# Patient Record
Sex: Female | Born: 1975 | Race: White | Hispanic: No | Smoking: Former smoker
Health system: Southern US, Community
[De-identification: ages and names within clinical notes are randomized; demographics above are authoritative.]

## PROBLEM LIST (undated history)

## (undated) DIAGNOSIS — J45909 Unspecified asthma, uncomplicated: Secondary | ICD-10-CM

## (undated) HISTORY — DX: Unspecified asthma, uncomplicated: J45.909

## (undated) HISTORY — PX: TUBAL LIGATION: SHX77

---

## 2004-12-19 ENCOUNTER — Emergency Department (HOSPITAL_COMMUNITY): Admission: EM | Admit: 2004-12-19 | Discharge: 2004-12-19 | Payer: Self-pay | Admitting: Emergency Medicine

## 2005-09-01 ENCOUNTER — Other Ambulatory Visit: Admission: RE | Admit: 2005-09-01 | Discharge: 2005-09-01 | Payer: Self-pay | Admitting: Obstetrics and Gynecology
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
## 2011-10-02 ENCOUNTER — Emergency Department (HOSPITAL_BASED_OUTPATIENT_CLINIC_OR_DEPARTMENT_OTHER)
Admission: EM | Admit: 2011-10-02 | Discharge: 2011-10-02 | Disposition: A | Discharge status: Home / Self Care | Payer: Commercial Managed Care - PPO | Attending: Emergency Medicine | Admitting: Emergency Medicine

## 2011-10-02 ENCOUNTER — Encounter (HOSPITAL_BASED_OUTPATIENT_CLINIC_OR_DEPARTMENT_OTHER): Payer: Self-pay | Admitting: *Deleted

## 2011-10-02 DIAGNOSIS — Y93G1 Activity, food preparation and clean up: Secondary | ICD-10-CM | POA: Insufficient documentation

## 2011-10-02 DIAGNOSIS — S61519A Laceration without foreign body of unspecified wrist, initial encounter: Secondary | ICD-10-CM

## 2011-10-02 DIAGNOSIS — S61209A Unspecified open wound of unspecified finger without damage to nail, initial encounter: Secondary | ICD-10-CM | POA: Insufficient documentation

## 2011-10-02 DIAGNOSIS — W268XXA Contact with other sharp object(s), not elsewhere classified, initial encounter: Secondary | ICD-10-CM | POA: Insufficient documentation

## 2011-10-02 DIAGNOSIS — S61509A Unspecified open wound of unspecified wrist, initial encounter: Secondary | ICD-10-CM | POA: Insufficient documentation

## 2011-10-02 DIAGNOSIS — Y92009 Unspecified place in unspecified non-institutional (private) residence as the place of occurrence of the external cause: Secondary | ICD-10-CM | POA: Insufficient documentation

## 2011-10-02 DIAGNOSIS — S61219A Laceration without foreign body of unspecified finger without damage to nail, initial encounter: Secondary | ICD-10-CM

## 2011-10-02 NOTE — Discharge Instructions (Signed)
Laceration Care, Adult °A laceration is a cut that goes through all layers of the skin. The cut goes into the tissue beneath the skin. °HOME CARE °For stitches (sutures) or staples: °· Keep the cut clean and dry.  °· If you have a bandage (dressing), change it at least once a day. Change the bandage if it gets wet or dirty, or as told by your doctor.  °· Wash the cut with soap and water 2 times a day. Rinse the cut with water. Pat it dry with a clean towel.  °· Put a thin layer of medicated cream on the cut as told by your doctor.  °· You may shower after the first 24 hours. Do not soak the cut in water until the stitches are removed.  °· Only take medicines as told by your doctor.  °· Have your stitches or staples removed as told by your doctor.  °For skin adhesive strips: °· Keep the cut clean and dry.  °· Do not get the strips wet. You may take a bath, but be careful to keep the cut dry.  °· If the cut gets wet, pat it dry with a clean towel.  °· The strips will fall off on their own. Do not remove the strips that are still stuck to the cut.  °For wound glue: °· You may shower or take baths. Do not soak or scrub the cut. Do not swim. Avoid heavy sweating until the glue falls off on its own. After a shower or bath, pat the cut dry with a clean towel.  °· Do not put medicine on your cut until the glue falls off.  °· If you have a bandage, do not put tape over the glue.  °· Avoid lots of sunlight or tanning lamps until the glue falls off. Put sunscreen on the cut for the first year to reduce your scar.  °· The glue will fall off on its own. Do not pick at the glue.  °You may need a tetanus shot if: °· You cannot remember when you had your last tetanus shot.  °· You have never had a tetanus shot.  °If you need a tetanus shot and you choose not to have one, you may get tetanus. Sickness from tetanus can be serious. °GET HELP RIGHT AWAY IF:  °· Your pain does not get better with medicine.  °· Your arm, hand, leg, or  foot loses feeling (numbness) or changes color.  °· Your cut is bleeding.  °· Your joint feels weak, or you cannot use your joint.  °· You have painful lumps on your body.  °· Your cut is red, puffy (swollen), or painful.  °· You have a red line on the skin near the cut.  °· You have yellowish-white fluid (pus) coming from the cut.  °· You have a fever.  °· You have a bad smell coming from the cut or bandage.  °· Your cut breaks open before or after stitches are removed.  °· You notice something coming out of the cut, such as wood or glass.  °· You cannot move a finger or toe.  °MAKE SURE YOU:  °· Understand these instructions.  °· Will watch your condition.  °· Will get help right away if you are not doing well or get worse.  °Document Released: 12/03/2007 Document Revised: 06/05/2011 Document Reviewed: 12/10/2010 °[employer]® Patient Information ©2012 [employer]. °

## 2011-10-02 NOTE — ED Notes (Addendum)
Pt cut her right wrist (bilateral sides), right thumb and left pinky finger against a broken glass coffee mug in the sink tonight. Bleeding controlled. Tetanus UTD.

## 2011-10-02 NOTE — ED Notes (Signed)
Pt placed in betadine soak per triage. Bleeding controlled.

## 2011-10-02 NOTE — ED Provider Notes (Signed)
History     CSN: [id number]  Arrival date & time 10/02/11  2002   First MD Initiated Contact with Patient 10/02/11 2012      Chief Complaint  Patient presents with  . Extremity Laceration    (Consider location/radiation/quality/duration/timing/severity/associated sxs/prior treatment) HPI Comments: Pt states that the mug was broken and it cut her while washing it is the sink:pt c/o multiple laceration  Patient is a 36 y.o. female presenting with skin laceration. The history is provided by the patient. No language interpreter was used.  Laceration  The incident occurred less than 1 hour ago. The laceration is located on the left hand and right hand. Injury mechanism: broken ceramic mug. The pain is mild. The pain has been constant since onset. She reports no foreign bodies present. Her tetanus status is UTD.    History reviewed. No pertinent past medical history.  Past Surgical History  Procedure Date  . Tubal ligation     No family history on file.  History  Substance Use Topics  . Smoking status: Former Games developer  . Smokeless tobacco: Not on file  . Alcohol Use: No    OB History    Grav Para Term Preterm Abortions TAB SAB Ect Mult Living                  Review of Systems  Constitutional: Negative.   Eyes: Negative.   Respiratory: Negative.   Cardiovascular: Negative.   Skin: Positive for wound.  Neurological: Negative.     Allergies  Review of patient's allergies indicates no known allergies.  Home Medications  No current outpatient prescriptions on file.  BP 134/88  Pulse 92  Temp(Src) 97.7 F (36.5 C) (Oral)  Resp 18  Ht 5\' 7"  (1.702 m)  Wt 170 lb (77.111 kg)  BMI 26.63 kg/m2  SpO2 100%  LMP 09/25/2011  Physical Exam  Nursing note and vitals reviewed. Constitutional: She is oriented to person, place, and time. She appears well-developed and well-nourished.  Cardiovascular: Normal rate and regular rhythm.   Pulmonary/Chest: Effort normal and  breath sounds normal.  Musculoskeletal: Normal range of motion.  Neurological: She is alert and oriented to person, place, and time.  Skin:       Pt has a laceration noted to the middle phalanx of left index finger:pt has a laceration to the right thumb:pt has 2 laceration and wrist wrist    ED Course  LACERATION REPAIR Performed by: Teressa Lower Authorized by: Teressa Lower Consent: Verbal consent obtained. Written consent not obtained. Risks and benefits: risks, benefits and alternatives were discussed Consent given by: patient Patient understanding: patient states understanding of the procedure being performed Patient identity confirmed: verbally with patient Time out: Immediately prior to procedure a "time out" was called to verify the correct patient, procedure, equipment, support staff and site/side marked as required. Body area: upper extremity Location details: left small finger Laceration length: 1 cm Foreign bodies: no foreign bodies Irrigation solution: saline Irrigation method: syringe Amount of cleaning: standard Skin closure: glue Approximation: close Approximation difficulty: simple Patient tolerance: Patient tolerated the procedure well with no immediate complications.  LACERATION REPAIR Performed by: Teressa Lower Authorized by: Teressa Lower Patient identity confirmed: verbally with patient Time out: Immediately prior to procedure a "time out" was called to verify the correct patient, procedure, equipment, support staff and site/side marked as required. Body area: upper extremity Location details: right thumb Laceration length: 1 cm Foreign bodies: no foreign bodies Irrigation solution: saline Irrigation method:  syringe Amount of cleaning: standard Skin closure: glue  LACERATION REPAIR Performed by: Teressa Lower Authorized by: Teressa Lower Patient identity confirmed: verbally with patient Time out: Immediately prior to  procedure a "time out" was called to verify the correct patient, procedure, equipment, support staff and site/side marked as required. Body area: upper extremity Location details: right wrist Laceration length: 1.5 cm Foreign bodies: no foreign bodies Skin closure: glue  LACERATION REPAIR Performed by: Teressa Lower Authorized by: Teressa Lower Patient identity confirmed: verbally with patient Time out: Immediately prior to procedure a "time out" was called to verify the correct patient, procedure, equipment, support staff and site/side marked as required. Body area: upper extremity Location details: right wrist Foreign bodies: no foreign bodies Skin closure: glue   (including critical care time)  Labs Reviewed - No data to display No results found.   1. Finger laceration   2. Wrist laceration       MDM  Wounds closed:no concern for RU:[id number] of lacerations with mechanism        Teressa Lower, NP 10/02/11 2107

## 2011-10-02 NOTE — ED Provider Notes (Signed)
Medical screening examination/treatment/procedure(s) were performed by non-physician practitioner and as supervising physician I was immediately available for consultation/collaboration.   Shanese Riemenschneider A Shakim Faith, MD 10/02/11 2321 

## 2013-06-30 HISTORY — PX: RADICAL HYSTERECTOMY: SHX2283

## 2018-06-26 ENCOUNTER — Encounter: Payer: Self-pay | Admitting: Emergency Medicine

## 2018-06-26 ENCOUNTER — Emergency Department (HOSPITAL_COMMUNITY): Payer: 59

## 2018-06-26 ENCOUNTER — Emergency Department (HOSPITAL_COMMUNITY)
Admission: EM | Admit: 2018-06-26 | Discharge: 2018-06-26 | Disposition: A | Discharge status: Home / Self Care | Payer: 59 | Attending: Emergency Medicine | Admitting: Emergency Medicine

## 2018-06-26 DIAGNOSIS — Z87891 Personal history of nicotine dependence: Secondary | ICD-10-CM | POA: Diagnosis not present

## 2018-06-26 DIAGNOSIS — L0231 Cutaneous abscess of buttock: Secondary | ICD-10-CM | POA: Diagnosis present

## 2018-06-26 LAB — CBC WITH DIFFERENTIAL/PLATELET
ABS IMMATURE GRANULOCYTES: 0.06 10*3/uL (ref 0.00–0.07)
BASOS ABS: 0.1 10*3/uL (ref 0.0–0.1)
Basophils Relative: 1 %
Eosinophils Absolute: 0.2 10*3/uL (ref 0.0–0.5)
Eosinophils Relative: 2 %
HEMATOCRIT: 43.3 % (ref 36.0–46.0)
HEMOGLOBIN: 14.2 g/dL (ref 12.0–15.0)
Immature Granulocytes: 1 %
LYMPHS ABS: 1.5 10*3/uL (ref 0.7–4.0)
LYMPHS PCT: 17 %
MCH: 32.9 pg (ref 26.0–34.0)
MCHC: 32.8 g/dL (ref 30.0–36.0)
MCV: 100.2 fL — ABNORMAL HIGH (ref 80.0–100.0)
MONO ABS: 0.7 10*3/uL (ref 0.1–1.0)
Monocytes Relative: 8 %
NEUTROS ABS: 6.7 10*3/uL (ref 1.7–7.7)
Neutrophils Relative %: 71 %
Platelets: 201 10*3/uL (ref 150–400)
RBC: 4.32 MIL/uL (ref 3.87–5.11)
RDW: 11.7 % (ref 11.5–15.5)
WBC: 9.2 10*3/uL (ref 4.0–10.5)
nRBC: 0 % (ref 0.0–0.2)

## 2018-06-26 LAB — BASIC METABOLIC PANEL
Anion gap: 7 (ref 5–15)
BUN: 10 mg/dL (ref 6–20)
CHLORIDE: 106 mmol/L (ref 98–111)
CO2: 26 mmol/L (ref 22–32)
Calcium: 8.9 mg/dL (ref 8.9–10.3)
Creatinine, Ser: 0.87 mg/dL (ref 0.44–1.00)
GFR calc Af Amer: >60 mL/min (ref >60)
GFR calc non Af Amer: >60 mL/min (ref >60)
Glucose, Bld: 94 mg/dL (ref 70–99)
POTASSIUM: 4.2 mmol/L (ref 3.5–5.1)
SODIUM: 139 mmol/L (ref 135–145)

## 2018-06-26 LAB — I-STAT BETA HCG BLOOD, ED (MC, WL, AP ONLY)

## 2018-06-26 MED ORDER — IOHEXOL 300 MG/ML  SOLN
100.0000 mL | Freq: Once | INTRAMUSCULAR | Status: AC | PRN
  Administered 2018-06-26: 100 mL via INTRAVENOUS

## 2018-06-26 MED ORDER — LIDOCAINE HCL (PF) 1 % IJ SOLN
5.0000 mL | Freq: Once | INTRAMUSCULAR | Status: AC
  Administered 2018-06-26: 5 mL
  Filled 2018-06-26: qty 5

## 2018-06-26 NOTE — Discharge Instructions (Signed)
Complete your antibiotic course. Follow-up with your primary care provider. Return to ED for worsening symptoms, fevers, increased drainage or swelling, blood in your stool.

## 2018-06-26 NOTE — ED Triage Notes (Signed)
Pt endorses having abscess between vaginal and rectum x 1 week. Went to pcp and was placed on antibiotics and set up an appointment with a GI MD due to possible fistula. VSS, Afebrile.

## 2018-06-26 NOTE — ED Provider Notes (Signed)
[facility]   CSN: [id number] Arrival date & time: 06/26/18  [id number]     History   Chief Complaint Chief Complaint  Patient presents with  . Abscess    HPI Heather SchirmerLisa Mcgrath is a 42 y.o. female who presents to ED for 1 week history of abscess near her rectum.  States that she had spontaneous drainage from the area last week from her rectum.  She was seen by her PCP and prescribed Bactrim.  She finished the Bactrim, went back to her PCP due to increased swelling.  She was then given IM Rocephin and switched to doxycycline.  Since then, the swelling has worsened.  She denies any drainage at this time.  No history of similar symptoms in the past.  She does note pain with having a bowel movement yesterday and did have to take a laxative.  She was referred to GI due to possibility of fistula.  Denies any fevers, other vaginal complaints, hematochezia or melena, urinary symptoms.  HPI  No past medical history on file.  There are no active problems to display for this patient.   Past Surgical History:  Procedure Laterality Date  . TUBAL LIGATION       OB History   No obstetric history on file.      Home Medications    Prior to Admission medications   Medication Sig Start Date End Date Taking? Authorizing Provider  acetaminophen (TYLENOL) 500 MG tablet Take 1,000 mg by mouth every 6 (six) hours as needed for headache.   Yes [provider]  albuterol (PROVENTIL HFA;VENTOLIN HFA) 108 (90 BASE) MCG/ACT inhaler Inhale 1 puff into the lungs every 6 (six) hours as needed for shortness of breath.    Yes [provider]  ibuprofen (ADVIL,MOTRIN) 200 MG tablet Take 800 mg by mouth every 6 (six) hours as needed for headache.    Yes [provider]  naproxen sodium (ANAPROX) 220 MG tablet Take 220 mg by mouth 2 (two) times daily as needed (cramps).    Yes [provider]    Family History No family  history on file.  Social History Social History   Tobacco Use  . Smoking status: Former Smoker  Substance Use Topics  . Alcohol use: No  . Drug use: No     Allergies   Patient has no known allergies.   Review of Systems Review of Systems  Constitutional: Negative for appetite change, chills and fever.  HENT: Negative for ear pain, rhinorrhea, sneezing and sore throat.   Eyes: Negative for photophobia and visual disturbance.  Respiratory: Negative for cough, chest tightness, shortness of breath and wheezing.   Cardiovascular: Negative for chest pain and palpitations.  Gastrointestinal: Negative for abdominal pain, blood in stool, constipation, diarrhea, nausea and vomiting.  Genitourinary: Negative for dysuria, hematuria and urgency.  Musculoskeletal: Negative for myalgias.  Skin: Positive for wound. Negative for rash.  Neurological: Negative for dizziness, weakness and light-headedness.     Physical Exam Updated Vital Signs BP 109/80 (BP Location: Right Arm)   Pulse 98   Temp 98.1 F (36.7 C)   Resp 16   LMP 09/25/2011   SpO2 100%   Physical Exam Vitals signs and nursing note reviewed. Exam conducted with a chaperone present.  Constitutional:      General: She is not in acute distress.    Appearance: She is well-developed.  HENT:     Head: Normocephalic and atraumatic.  Nose: Nose normal.  Eyes:     General: No scleral icterus.       Left eye: No discharge.     Conjunctiva/sclera: Conjunctivae normal.  Neck:     Musculoskeletal: Normal range of motion and neck supple.  Cardiovascular:     Rate and Rhythm: Normal rate and regular rhythm.     Heart sounds: Normal heart sounds. No murmur. No friction rub. No gallop.   Pulmonary:     Effort: Pulmonary effort is normal. No respiratory distress.     Breath sounds: Normal breath sounds.  Abdominal:     General: Bowel sounds are normal. There is no distension.     Palpations: Abdomen is soft.      Tenderness: There is no abdominal tenderness. There is no guarding.  Genitourinary:      Comments: 2 cm area of induration on the left buttock near the rectum.  Pain with rectal exam. Musculoskeletal: Normal range of motion.  Skin:    General: Skin is warm and dry.     Findings: No rash.  Neurological:     Mental Status: She is alert.     Motor: No abnormal muscle tone.     Coordination: Coordination normal.      ED Treatments / Results  Labs (all labs ordered are listed, but only abnormal results are displayed) Labs Reviewed  CBC WITH DIFFERENTIAL/PLATELET - Abnormal; Notable for the following components:      Result Value   MCV 100.2 (*)    All other components within normal limits  BASIC METABOLIC PANEL  I-STAT BETA HCG BLOOD, ED ([facility], [facility], [facility] ONLY)    EKG None  Radiology Ct Abdomen Pelvis W Contrast  Result Date: 06/26/2018 CLINICAL DATA:  42 year old female with history of abscess between the vagina and rectum for 1 week. EXAM: CT ABDOMEN AND PELVIS WITH CONTRAST TECHNIQUE: Multidetector CT imaging of the abdomen and pelvis was performed using the standard protocol following bolus administration of intravenous contrast. CONTRAST:  OMNIPAQUE IOHEXOL 300 MG/ML  SOLN COMPARISON:  No priors. FINDINGS: Lower chest: Unremarkable. Hepatobiliary: No suspicious cystic or solid hepatic lesions. No intra or extrahepatic biliary ductal dilatation. Gallbladder is normal in appearance. Pancreas: No pancreatic mass. No pancreatic ductal dilatation. No pancreatic or peripancreatic fluid or inflammatory changes. Spleen: Unremarkable. Adrenals/Urinary Tract: Bilateral kidneys and bilateral adrenal glands are normal in appearance. No hydroureteronephrosis. Urinary bladder is normal in appearance. Stomach/Bowel: Normal appearance of the stomach. No pathologic dilatation of small bowel or colon. Normal appendix. Vascular/Lymphatic: No significant atherosclerotic disease, aneurysm or  dissection noted in the abdominal or pelvic vasculature. No lymphadenopathy noted in the abdomen or pelvis. Reproductive: Status post hysterectomy. Ovaries are not confidently identified and may be surgically absent or atrophic. Other: No significant volume of ascites.  No pneumoperitoneum. Musculoskeletal: In the medial aspect of the left gluteal region there is a centrally intermediate attenuation (41 HU) rim enhancing lesion measuring 2.0 x 3.2 cm (axial image 22 of series 4), corresponding to the reported abscess. There are no aggressive appearing lytic or blastic lesions noted in the visualized portions of the skeleton. IMPRESSION: 1. 2.0 x 3.2 cm abscess in the medial aspect of the left gluteal region. 2. No other acute findings elsewhere in the abdomen or pelvis. Electronically Signed   By: Trudie Reed M.D.   On: 06/26/2018 11:05    Procedures .Marland KitchenIncision and Drainage Date/Time: 06/26/2018 12:19 PM Performed by: Dietrich Pates, PA-C Authorized by: Dietrich Pates, PA-C  Consent:    Consent obtained:  Verbal   Consent given by:  Patient   Risks discussed:  Bleeding, damage to other organs, incomplete drainage, infection and pain Location:    Type:  Abscess   Location:  Anogenital   Anogenital location:  Perirectal Pre-procedure details:    Skin preparation:  Betadine Anesthesia (see MAR for exact dosages):    Anesthesia method:  Local infiltration   Local anesthetic:  Lidocaine 1% w/o epi and lidocaine 2% WITH epi Procedure details:    Needle aspiration: yes     Needle size:  18 G   Incision types:  Stab incision   Scalpel blade:  11   Wound management:  Probed and deloculated and irrigated with saline   Drainage:  Purulent and bloody   Drainage amount:  Moderate   Packing materials:  1/4 in gauze Post-procedure details:    Patient tolerance of procedure:  Tolerated well, no immediate complications   (including critical care time)  Medications Ordered in ED Medications    iohexol (OMNIPAQUE) 300 MG/ML solution 100 mL (100 mLs Intravenous Contrast Given 06/26/18 1016)  lidocaine (PF) (XYLOCAINE) 1 % injection 5 mL (5 mLs Infiltration Given 06/26/18 1205)     Initial Impression / Assessment and Plan / ED Course  I have reviewed the triage vital signs and the nursing notes.  Pertinent labs & imaging results that were available during my care of the patient were reviewed by me and considered in my medical decision making (see chart for details).      Patient with skin abscess.  Lab work is unremarkable and CT shows 2 x 3 cm abscess in the medial left gluteal region.  No evidence of fistula.  Incision and drainage performed in the ED today with purulent drainage noted.  Packing was placed. No signs of systemic illness present. Advised patient to return for wound recheck in 2 days, sooner if signs of infection or increased bleeding/drainage noted. Supportive care and return precautions discussed.  She was placed on antibiotics by her PCP so I informed patient that she should continue and complete these medications.  Pt sent home with supportive treatment. The patient appears reasonably screened and/or stabilized for discharge. Strict return precautions given. Patient discussed with and seen by my attending, Dr. Anitra LauthPlunkett.  Patient is hemodynamically stable, in NAD, and able to ambulate in the ED. Evaluation does not show pathology that would require ongoing emergent intervention or inpatient treatment. I explained the diagnosis to the patient. Pain has been managed and has no complaints prior to discharge. Patient is comfortable with above plan and is stable for discharge at this time. All questions were answered prior to disposition. Strict return precautions for returning to the ED were discussed. Encouraged follow up with PCP.    Portions of this note were generated with Scientist, clinical (histocompatibility and immunogenetics)Dragon dictation software. Dictation errors may occur despite best attempts at  proofreading.   Final Clinical Impressions(s) / ED Diagnoses   Final diagnoses:  Abscess of buttock, left    ED Discharge Orders    None       Dietrich PatesKhatri, Pierra Skora, PA-C 06/26/18 1221    Gwyneth SproutPlunkett, [place], MD 06/27/18 1307

## 2018-06-26 NOTE — ED Notes (Signed)
No answer for room 

## 2018-06-29 ENCOUNTER — Other Ambulatory Visit: Payer: Self-pay

## 2018-06-29 ENCOUNTER — Emergency Department (HOSPITAL_COMMUNITY)
Admission: EM | Admit: 2018-06-29 | Discharge: 2018-06-29 | Disposition: A | Discharge status: Home / Self Care | Payer: 59 | Attending: Emergency Medicine | Admitting: Emergency Medicine

## 2018-06-29 ENCOUNTER — Encounter (HOSPITAL_COMMUNITY): Payer: Self-pay | Admitting: Emergency Medicine

## 2018-06-29 DIAGNOSIS — F17210 Nicotine dependence, cigarettes, uncomplicated: Secondary | ICD-10-CM | POA: Diagnosis not present

## 2018-06-29 DIAGNOSIS — L02215 Cutaneous abscess of perineum: Secondary | ICD-10-CM | POA: Diagnosis present

## 2018-06-29 DIAGNOSIS — L0291 Cutaneous abscess, unspecified: Secondary | ICD-10-CM

## 2018-06-29 MED ORDER — LIDOCAINE-EPINEPHRINE (PF) 2 %-1:200000 IJ SOLN
10.0000 mL | Freq: Once | INTRAMUSCULAR | Status: AC
  Administered 2018-06-29: 10 mL via INTRADERMAL
  Filled 2018-06-29: qty 20

## 2018-06-29 MED ORDER — CLINDAMYCIN HCL 150 MG PO CAPS: 450.0000 mg | ORAL_CAPSULE | Freq: Three times a day (TID) | ORAL | 0 refills | Status: AC

## 2018-06-29 MED ORDER — OXYCODONE-ACETAMINOPHEN 5-325 MG PO TABS: 1.0000 | ORAL_TABLET | ORAL | 0 refills | Status: AC | PRN

## 2018-06-29 MED ORDER — OXYCODONE-ACETAMINOPHEN 5-325 MG PO TABS
1.0000 | ORAL_TABLET | Freq: Once | ORAL | Status: AC
  Administered 2018-06-29: 1 via ORAL
  Filled 2018-06-29: qty 1

## 2018-06-29 MED ORDER — CLINDAMYCIN HCL 150 MG PO CAPS
450.0000 mg | ORAL_CAPSULE | Freq: Once | ORAL | Status: AC
  Administered 2018-06-29: 450 mg via ORAL
  Filled 2018-06-29: qty 3

## 2018-06-29 NOTE — ED Provider Notes (Signed)
[facility]ENC[facility] Provider Note   CSN: [id number] Arrival date & time: 06/29/18  [id number]     History   Chief Complaint Chief Complaint  Patient presents with  . Abscess    HPI Heather Mcgrath is a 42 y.o. female.  The history is provided by the patient, medical records and the spouse. No language interpreter was used.  Abscess  Location:  Pelvis Pelvic abscess location:  Perianal Size:  1 Abscess quality: draining, fluctuance, induration, painful, redness and warmth   Red streaking: no   Duration:  3 days Progression:  Worsening Pain details:    Quality:  Sharp   Severity:  Severe   Duration:  3 days   Timing:  Intermittent   Progression:  Waxing and waning Chronicity:  Recurrent Context: not diabetes   Relieved by:  Nothing Worsened by:  Draining/squeezing Ineffective treatments:  None tried Associated symptoms: no fever, no headaches and no vomiting     History reviewed. No pertinent past medical history.  There are no active problems to display for this patient.   Past Surgical History:  Procedure Laterality Date  . TUBAL LIGATION       OB History   No obstetric history on file.      Home Medications    Prior to Admission medications   Medication Sig Start Date End Date Taking? Authorizing Provider  acetaminophen (TYLENOL) 500 MG tablet Take 1,000 mg by mouth every 6 (six) hours as needed for headache.    [provider]  albuterol (PROVENTIL HFA;VENTOLIN HFA) 108 (90 BASE) MCG/ACT inhaler Inhale 1 puff into the lungs every 6 (six) hours as needed for shortness of breath.     [provider]  ibuprofen (ADVIL,MOTRIN) 200 MG tablet Take 800 mg by mouth every 6 (six) hours as needed for headache.     [provider]  naproxen sodium (ANAPROX) 220 MG tablet Take 220 mg by mouth 2 (two) times daily as needed (cramps).     [provider]    Family History History reviewed. No  pertinent family history.  Social History Social History   Tobacco Use  . Smoking status: Current Every Day Smoker    Packs/day: 0.50    Types: Cigarettes  . Smokeless tobacco: Never Used  Substance Use Topics  . Alcohol use: No  . Drug use: No     Allergies   Patient has no known allergies.   Review of Systems Review of Systems  Constitutional: Negative for chills and fever.  HENT: Negative for ear pain and sore throat.   Eyes: Negative for pain and visual disturbance.  Respiratory: Negative for cough, chest tightness and shortness of breath.   Cardiovascular: Negative for chest pain and palpitations.  Gastrointestinal: Negative for abdominal pain and vomiting.  Genitourinary: Negative for dysuria, flank pain and hematuria.  Musculoskeletal: Negative for arthralgias, back pain, neck pain and neck stiffness.  Skin: Positive for color change and wound. Negative for rash.  Neurological: Negative for seizures, syncope, light-headedness and headaches.  Psychiatric/Behavioral: Negative for agitation.  All other systems reviewed and are negative.    Physical Exam Updated Vital Signs BP 117/77 (BP Location: Right Arm)   Pulse 98   Temp 97.7 F (36.5 C) (Oral)   Ht 5\' 8"  (1.727 m)   Wt 75.8 kg   LMP 09/25/2011   SpO2 100%   BMI 25.39 kg/m   Physical Exam Vitals signs and nursing note reviewed. Exam conducted with a  chaperone present.  Constitutional:      General: She is not in acute distress.    Appearance: She is well-developed. She is not ill-appearing, toxic-appearing or diaphoretic.  HENT:     Head: Normocephalic and atraumatic.     Nose: No congestion or rhinorrhea.     Mouth/Throat:     Pharynx: No oropharyngeal exudate.  Eyes:     Conjunctiva/sclera: Conjunctivae normal.  Neck:     Musculoskeletal: Neck supple.  Cardiovascular:     Rate and Rhythm: Normal rate and regular rhythm.     Heart sounds: No murmur.  Pulmonary:     Effort: Pulmonary effort  is normal. No respiratory distress.     Breath sounds: Normal breath sounds. No wheezing.  Abdominal:     Palpations: Abdomen is soft.     Tenderness: There is no abdominal tenderness.  Genitourinary:    Labia:        Right: No tenderness.        Left: No tenderness.      Rectum: Normal. No external hemorrhoid. Normal anal tone.    Musculoskeletal:        General: Tenderness present.  Skin:    General: Skin is warm and dry.     Capillary Refill: Capillary refill takes less than 2 seconds.     Findings: Erythema present.  Neurological:     Mental Status: She is alert.      ED Treatments / Results  Labs (all labs ordered are listed, but only abnormal results are displayed) Labs Reviewed  AEROBIC CULTURE (SUPERFICIAL SPECIMEN)    EKG None  Radiology No results found.  Procedures .Marland KitchenIncision and Drainage Date/Time: 06/29/2018 4:06 PM Performed by: Heide Scales, MD Authorized by: Heide Scales, MD   Consent:    Consent obtained:  Verbal   Consent given by:  Patient   Risks discussed:  Incomplete drainage, pain, infection, bleeding and damage to other organs   Alternatives discussed:  No treatment Location:    Type:  Abscess   Size:  1   Location:  Anogenital   Anogenital location:  Perineum Pre-procedure details:    Skin preparation:  Chloraprep Anesthesia (see MAR for exact dosages):    Anesthesia method:  Local infiltration   Local anesthetic:  Lidocaine 2% WITH epi Procedure type:    Complexity:  Simple Procedure details:    Incision types:  Single straight   Incision depth:  Dermal   Scalpel blade:  11   Wound management:  Probed and deloculated, irrigated with saline and extensive cleaning   Drainage:  Bloody, purulent and serosanguinous   Drainage amount:  Moderate   Wound treatment:  Wound left open and drain placed   Packing materials:  1/2 in iodoform gauze   Amount 1/2" iodoform:  3cm Post-procedure details:    Patient  tolerance of procedure:  Tolerated well, no immediate complications   (including critical care time)  Medications Ordered in ED Medications  lidocaine-EPINEPHrine (XYLOCAINE W/EPI) 2 %-1:200000 (PF) injection 10 mL (10 mLs Intradermal Given 06/29/18 1038)  oxyCODONE-acetaminophen (PERCOCET/ROXICET) 5-325 MG per tablet 1 tablet (1 tablet Oral Given 06/29/18 1038)  clindamycin (CLEOCIN) capsule 450 mg (450 mg Oral Given 06/29/18 1129)     Initial Impression / Assessment and Plan / ED Course  I have reviewed the triage vital signs and the nursing notes.  Pertinent labs & imaging results that were available during my care of the patient were reviewed by me and considered in  my medical decision making (see chart for details).     Heather Mcgrath is a 42 y.o. female with a past medical history significant for recent perineal abscess who presents for continued pain.  Patient says that 3 days ago she had an abscess drained that was near her rectum that was shown to have no evidence of fistula on CT imaging.  Patient says she has been on antibiotics for it but shortly after leaving the emergency department, the packing fell out and it resealed.  She reports the pain and swelling has worsened over the last 2 days and she is concerned there is still infection present.  She reports her pain is moderate to severe.  She denies any fevers, chills, nausea, vomiting, urinary symptoms or other GI symptoms.  She denies any abdominal pain chest pain or back pain.  No other symptoms aside from worsened swelling and redness and tenderness of the abscess.  On external exam, lungs are clear and chest is nontender.  Abdomen is nontender.  No CVA tenderness or back tenderness.  A chaperone will be present for evaluation of the abscess area.  Anticipate I&D and discharged on further antibiotics and pain medication.  Incision and drainage was performed without difficulty and a moderate amount of purulence was removed.   Packing was placed although the cavity was shallow.  Patient will be started on new antibiotics.  Patient given prescription for pain medication and antibiotics and will follow-up with PCP.  Patient understood return precautions and follow-up instructions.  Patient had no other questions or concerns and was discharged in good condition after incision and drainage and antibiotic management.  Final Clinical Impressions(s) / ED Diagnoses   Final diagnoses:  Abscess    ED Discharge Orders         Ordered    clindamycin (CLEOCIN) 150 MG capsule  3 times daily     06/29/18 1110    oxyCODONE-acetaminophen (PERCOCET/ROXICET) 5-325 MG tablet  Every 4 hours PRN     06/29/18 1110         Clinical Impression: 1. Abscess     Disposition: Discharge  Condition: Good  I have discussed the results, Dx and Tx plan with the pt(& family if present). He/she/they expressed understanding and agree(s) with the plan. Discharge instructions discussed at great length. Strict return precautions discussed and pt &/or family have verbalized understanding of the instructions. No further questions at time of discharge.    Discharge Medication List as of 06/29/2018 11:12 AM    START taking these medications   Details  clindamycin (CLEOCIN) 150 MG capsule Take 3 capsules (450 mg total) by mouth 3 (three) times daily for 10 days., Starting Tue 06/29/2018, Until Fri 07/09/2018, Print    oxyCODONE-acetaminophen (PERCOCET/ROXICET) 5-325 MG tablet Take 1 tablet by mouth every 4 (four) hours as needed for severe pain., Starting Tue 06/29/2018, Print        Follow Up: Courtney ParisMcCoy, Rachel, NP [address] [place] [address] [phone]     [facility] [address] [id number] [place] [id number] [phone]       Kyandre Okray, Canary Brimhristopher J, MD 06/29/18 [phone]

## 2018-06-29 NOTE — ED Triage Notes (Signed)
Per Pt she was recently seen here for abscess complication where a CT was done and an I and D.  She reports the packing fell out soon after she left the abscess is near rectum and she reports that it has closed and is red swollen and painful again.  NAD noted at this time.

## 2018-06-29 NOTE — Discharge Instructions (Signed)
Your infection appears to have resealed and an abscess that reformed.  We performed another incision and drainage and got more purulence out.  We sent a culture of the pus.  Please take the new antibiotic for the next 10 days to help with this.  Please continue watching the infection.  Please take the pain medicine help with your discomfort and rest.  Please dehydrated.  Please follow-up with your primary doctor the neck several days.  If any symptoms change or worsen, please return to the nearest emergency department.

## 2018-06-30 HISTORY — PX: ANAL FISTULECTOMY: SHX1139

## 2018-07-02 LAB — AEROBIC CULTURE W GRAM STAIN (SUPERFICIAL SPECIMEN)

## 2018-07-02 LAB — AEROBIC CULTURE  (SUPERFICIAL SPECIMEN)

## 2018-07-03 ENCOUNTER — Telehealth: Payer: Self-pay

## 2018-07-03 NOTE — Telephone Encounter (Signed)
Spoke with Pt re: Aerobic culture on 06/29/18 to change abx treatment. Has already seen surgeon and placed on Augmentin.

## 2018-07-03 NOTE — Progress Notes (Signed)
ED Antimicrobial Stewardship Positive Culture Follow Up   Heather Mcgrath is an 42 y.o. female who presented to [facility] on 06/29/2018 with a chief complaint of perineal abscess requiring I&D and packing  Chief Complaint  Patient presents with  . Abscess    Recent Results (from the past 720 hour(s))  Aerobic Culture (superficial specimen)     Status: None   Collection Time: 06/29/18 11:30 AM  Result Value Ref Range Status   Specimen Description PERIRECTAL  Final   Special Requests NONE  Final   Gram Stain   Final    MODERATE WBC PRESENT, PREDOMINANTLY PMN RARE GRAM POSITIVE RODS Performed at [facility], [address], [place], [place] [address]    Culture FEW ENTEROCOCCUS FAECALIS  Final   Report Status 07/02/2018 FINAL  Final   Organism ID, Bacteria ENTEROCOCCUS FAECALIS  Final      Susceptibility   Enterococcus faecalis - MIC*    AMPICILLIN <=2 SENSITIVE Sensitive     VANCOMYCIN 2 SENSITIVE Sensitive     GENTAMICIN SYNERGY SENSITIVE Sensitive     * FEW ENTEROCOCCUS FAECALIS    [x]  Treated with Clindamycin, organism resistant to prescribed antimicrobial []  Patient discharged originally without antimicrobial agent and treatment is now indicated  New antibiotic prescription: Augmentin 875 mg twice daily x 10 days. Stop clindamycin   ED Provider: Clista Bernhardt, PharmD., BCPS Clinical Pharmacist Clinical phone for 07/03/18 until 3:30pm: [phone] If after 3:30pm, please refer to [facility] for unit-specific pharmacist

## 2019-06-06 ENCOUNTER — Other Ambulatory Visit: Payer: Self-pay

## 2019-06-06 DIAGNOSIS — Z20822 Contact with and (suspected) exposure to covid-19: Secondary | ICD-10-CM

## 2019-06-07 LAB — NOVEL CORONAVIRUS, NAA: SARS-CoV-2, NAA: NOT DETECTED

## 2019-08-26 ENCOUNTER — Ambulatory Visit: Payer: 59 | Admitting: Family

## 2019-08-30 ENCOUNTER — Encounter: Payer: Self-pay | Admitting: Family

## 2019-08-30 ENCOUNTER — Other Ambulatory Visit: Payer: Self-pay

## 2019-08-30 ENCOUNTER — Ambulatory Visit (INDEPENDENT_AMBULATORY_CARE_PROVIDER_SITE_OTHER): Payer: BC Managed Care – PPO | Admitting: Family

## 2019-08-30 VITALS — BP 121/78 | HR 78 | Temp 97.0°F | Resp 16 | Ht 67.0 in | Wt 178.0 lb

## 2019-08-30 DIAGNOSIS — J45909 Unspecified asthma, uncomplicated: Secondary | ICD-10-CM

## 2019-08-30 DIAGNOSIS — K603 Anal fistula: Secondary | ICD-10-CM | POA: Diagnosis not present

## 2019-08-30 DIAGNOSIS — K219 Gastro-esophageal reflux disease without esophagitis: Secondary | ICD-10-CM | POA: Diagnosis not present

## 2019-08-30 DIAGNOSIS — R1031 Right lower quadrant pain: Secondary | ICD-10-CM | POA: Diagnosis not present

## 2019-08-30 DIAGNOSIS — E559 Vitamin D deficiency, unspecified: Secondary | ICD-10-CM | POA: Diagnosis not present

## 2019-08-30 DIAGNOSIS — E2839 Other primary ovarian failure: Secondary | ICD-10-CM

## 2019-08-30 NOTE — Patient Instructions (Signed)
Welcome to [facility]! Please complete lab work prior to leaving.

## 2019-08-30 NOTE — Progress Notes (Signed)
Subjective:    Patient ID: Heather Mcgrath, female    DOB: 1976-04-19, 44 y.o.   MRN: [id number]  HPI  Patient is a 44 yr old female who presents today to establish care.   pmhx is significant for:  1. Asthma- reports that her asthma symptoms are sporadic.  She is a former smoker- quit in 2017.    2. Anal fistulectomy- 2020.  She reports that she had a tunneling abscess which drained near the right perineum prior to surgery.   Reports that she woke up in the middle of the night gasping and coughing hard. When she got up the next day she noted some perineal discomfort.  Waited 1 week no improvement. She was previously followed by [facility].  Has some discomfort in the right perineal area x 3 months.  Leaning forward seems to worsen the discomfort.  Not tender ot the touch.  Worried that this could be a recurrence of her fistula. Notes that symptoms have been improving the last couple of weeks.   3. Vit D deficiency- not currently taking vit D supplement  She saw Dr. Phineas Douglas in the past for primary care.   Reports that she is doing bioidentical hormone pellets.  Q3 months.  Goes to [employer].     Review of Systems See HPI  Past Medical History:  Diagnosis Date  . Asthma      Social History   Socioeconomic History  . Marital status: Divorced    Spouse name: Not on file  . Number of children: Not on file  . Years of education: Not on file  . Highest education level: Not on file  Occupational History  . Not on file  Tobacco Use  . Smoking status: Former Smoker    Packs/day: 0.50    Types: Cigarettes    Quit date: 2017    Years since quitting: 4.1  . Smokeless tobacco: Never Used  Substance and Sexual Activity  . Alcohol use: Yes    Comment: occasionally  . Drug use: No  . Sexual activity: Yes    Birth control/protection: Surgical    Comment: hysterectomy  Other Topics Concern  . Not on file  Social History Narrative  . Not on file   Social Determinants of  Health   Financial Resource Strain:   . Difficulty of Paying Living Expenses: Not on file  Food Insecurity:   . Worried About Charity fundraiser in the Last Year: Not on file  . Ran Out of Food in the Last Year: Not on file  Transportation Needs:   . Lack of Transportation (Medical): Not on file  . Lack of Transportation (Non-Medical): Not on file  Physical Activity:   . Days of Exercise per Week: Not on file  . Minutes of Exercise per Session: Not on file  Stress:   . Feeling of Stress : Not on file  Social Connections:   . Frequency of Communication with Friends and Family: Not on file  . Frequency of Social Gatherings with Friends and Family: Not on file  . Attends Religious Services: Not on file  . Active Member of Clubs or Organizations: Not on file  . Attends Archivist Meetings: Not on file  . Marital Status: Not on file  Intimate Partner Violence:   . Fear of Current or Ex-Partner: Not on file  . Emotionally Abused: Not on file  . Physically Abused: Not on file  . Sexually Abused: Not on file  Past Surgical History:  Procedure Laterality Date  . ANAL FISTULECTOMY  2020  . RADICAL HYSTERECTOMY  2015  . TUBAL LIGATION      Family History  Problem Relation Age of Onset  . Ovarian cancer Mother        died at age 28  . Heart disease Father   . Colon cancer Father        diagnoed around age 22  . Hypertension Father   . Seizures Sister   . Multiple sclerosis Brother     No Known Allergies  Current Outpatient Medications on File Prior to Visit  Medication Sig Dispense Refill  . acetaminophen (TYLENOL) 500 MG tablet Take 1,000 mg by mouth every 6 (six) hours as needed for headache.    . albuterol (PROVENTIL HFA;VENTOLIN HFA) 108 (90 BASE) MCG/ACT inhaler Inhale 1 puff into the lungs every 6 (six) hours as needed for shortness of breath.     Marland Kitchen ibuprofen (ADVIL,MOTRIN) 200 MG tablet Take 800 mg by mouth every 6 (six) hours as needed for headache.       . naproxen sodium (ANAPROX) 220 MG tablet Take 220 mg by mouth 2 (two) times daily as needed (cramps).     Marland Kitchen oxyCODONE-acetaminophen (PERCOCET/ROXICET) 5-325 MG tablet Take 1 tablet by mouth every 4 (four) hours as needed for severe pain. 15 tablet 0   No current facility-administered medications on file prior to visit.    BP 121/78 (BP Location: Right Arm, Patient Position: Sitting, Cuff Size: Small)   Pulse 78   Temp (!) 97 F (36.1 C) (Temporal)   Resp 16   Ht 5\' 7"  (1.702 m)   Wt 178 lb (80.7 kg)   LMP 09/25/2011   SpO2 100%   BMI 27.88 kg/m       Objective:   Physical Exam Constitutional:      Appearance: She is well-developed.  Cardiovascular:     Rate and Rhythm: Normal rate and regular rhythm.     Heart sounds: Normal heart sounds. No murmur.  Pulmonary:     Effort: Pulmonary effort is normal. No respiratory distress.     Breath sounds: Normal breath sounds. No wheezing.  Genitourinary:    General: Normal vulva.     Labia:        Right: No rash or lesion.        Left: No rash or lesion.      Vagina: Normal.     Uterus: Absent.      Adnexa: Right adnexa normal and left adnexa normal.     Rectum: Normal.     Comments: No palpable tenderness or abnormalities noted in rectum or vagina.  No vulvar or pernineal tenderness to palpation. No erythema, no drainage/discharge noted.  Psychiatric:        Behavior: Behavior normal.        Thought Content: Thought content normal.        Judgment: Judgment normal.           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 CBC is performed and notes normal white blood cell count. Pt is advised to let me know if discomfort does not continue to improve. We would plan referral back to her surgeon at that time.   Asthma- clinically stable.   GERD- I am concerned that she is having some gerd symptoms that are waking her up in her sleep. We discussed GERD precautions.  If recurrent symptoms  consider addition of PPI.   Vitamin D deficiency- obtain follow up vitamin D level.  Estrogen deficiency- s/p oophorectomy. Currently maintained on bioidentical hormone therapy pellets which are being prescribed by [facility].   This visit occurred during the SARS-CoV-2 public health emergency.  Safety protocols were in place, including screening questions prior to the visit, additional usage of staff PPE, and extensive cleaning of exam room while observing appropriate contact time as indicated for disinfecting solutions.

## 2019-08-31 LAB — CBC WITH DIFFERENTIAL/PLATELET
Basophils Absolute: 0 10*3/uL (ref 0.0–0.1)
Basophils Relative: 0.9 % (ref 0.0–3.0)
Eosinophils Absolute: 0.1 10*3/uL (ref 0.0–0.7)
Eosinophils Relative: 1.6 % (ref 0.0–5.0)
HCT: 41.9 % (ref 36.0–46.0)
Hemoglobin: 14.3 g/dL (ref 12.0–15.0)
Lymphocytes Relative: 36.6 % (ref 12.0–46.0)
Lymphs Abs: 1.9 10*3/uL (ref 0.7–4.0)
MCHC: 34.1 g/dL (ref 30.0–36.0)
MCV: 95.5 fl (ref 78.0–100.0)
Monocytes Absolute: 0.4 10*3/uL (ref 0.1–1.0)
Monocytes Relative: 8.2 % (ref 3.0–12.0)
Neutro Abs: 2.7 10*3/uL (ref 1.4–7.7)
Neutrophils Relative %: 52.7 % (ref 43.0–77.0)
Platelets: 186 10*3/uL (ref 150.0–400.0)
RBC: 4.39 Mil/uL (ref 3.87–5.11)
RDW: 11.8 % (ref 11.5–15.5)
WBC: 5.2 10*3/uL (ref 4.0–10.5)

## 2019-09-01 ENCOUNTER — Encounter: Payer: Self-pay | Admitting: Family

## 2019-09-01 DIAGNOSIS — J45909 Unspecified asthma, uncomplicated: Secondary | ICD-10-CM

## 2019-09-01 DIAGNOSIS — K603 Anal fistula, unspecified: Secondary | ICD-10-CM

## 2019-09-01 DIAGNOSIS — E559 Vitamin D deficiency, unspecified: Secondary | ICD-10-CM

## 2019-09-01 DIAGNOSIS — K219 Gastro-esophageal reflux disease without esophagitis: Secondary | ICD-10-CM

## 2019-09-01 DIAGNOSIS — E2839 Other primary ovarian failure: Secondary | ICD-10-CM | POA: Insufficient documentation

## 2019-09-01 HISTORY — DX: Vitamin D deficiency, unspecified: E55.9

## 2019-09-01 HISTORY — DX: Gastro-esophageal reflux disease without esophagitis: K21.9

## 2019-09-01 HISTORY — DX: Unspecified asthma, uncomplicated: J45.909

## 2019-09-01 HISTORY — DX: Anal fistula, unspecified: K60.30

## 2019-09-01 HISTORY — DX: Anal fistula: K60.3

## 2019-09-02 LAB — VITAMIN D 1,25 DIHYDROXY
Vitamin D 1, 25 (OH)2 Total: 62 pg/mL (ref 18–72)
Vitamin D2 1, 25 (OH)2: <8 pg/mL
Vitamin D3 1, 25 (OH)2: 62 pg/mL

## 2019-09-27 ENCOUNTER — Ambulatory Visit: Payer: BC Managed Care – PPO | Admitting: Family

## 2024-04-26 ENCOUNTER — Other Ambulatory Visit: Payer: Self-pay | Admitting: Medical Genetics

## 2024-05-14 ENCOUNTER — Other Ambulatory Visit: Payer: Self-pay

## 2024-05-21 ENCOUNTER — Other Ambulatory Visit: Payer: Self-pay

## 2024-06-11 ENCOUNTER — Other Ambulatory Visit: Admission: RE | Admit: 2024-06-11 | Discharge: 2024-06-11

## 2024-06-21 LAB — GENECONNECT MOLECULAR SCREEN: Genetic Analysis Overall Interpretation: NEGATIVE
